# Patient Record
Sex: Male | Born: 1945 | Race: White | Hispanic: No | Marital: Single | Smoking: Never smoker
Health system: Southern US, Community
[De-identification: ages and names within clinical notes are randomized; demographics above are authoritative.]

## PROBLEM LIST (undated history)

## (undated) DIAGNOSIS — I480 Paroxysmal atrial fibrillation: Secondary | ICD-10-CM

## (undated) HISTORY — DX: Paroxysmal atrial fibrillation: I48.0

## (undated) HISTORY — PX: TONSILLECTOMY: SUR1361

---

## 2007-06-30 ENCOUNTER — Inpatient Hospital Stay (HOSPITAL_COMMUNITY): Admission: EM | Admit: 2007-06-30 | Discharge: 2007-07-01 | Payer: Self-pay | Admitting: Emergency Medicine

## 2007-07-01 ENCOUNTER — Encounter (INDEPENDENT_AMBULATORY_CARE_PROVIDER_SITE_OTHER): Payer: Self-pay | Admitting: Internal Medicine

## 2007-07-01 HISTORY — PX: TRANSTHORACIC ECHOCARDIOGRAM: SHX275

## 2007-07-29 HISTORY — PX: CARDIOVASCULAR STRESS TEST: SHX262

## 2010-09-20 ENCOUNTER — Ambulatory Visit: Payer: Self-pay | Admitting: Cardiovascular Disease

## 2011-02-24 NOTE — H&P (Signed)
[name], Underwood                ACCOUNT NO.:  [id number]   MEDICAL RECORD NO.:  [id number]          PATIENT TYPE:  INP   LOCATION:  1440                         FACILITY:  [facility]   PHYSICIAN:  Michelene Gardener, MD    DATE OF BIRTH:  [birth year]   DATE OF ADMISSION:  06/30/2007  DATE OF DISCHARGE:  07/01/2007                              HISTORY & PHYSICAL   CHIEF COMPLAINT:  Palpitations.   HISTORY OF PRESENT ILLNESS:  This is 65 year old male with no  significant past medical history, presented to the hospital with  palpitation.  The patient denied chest pain.  There is no shortness of  breath and there is no syncopal episode.  The patient does not have any  previous history of heart problems.  Upon arrival to the ER, he was  found to be in atrial fibrillation and rapid ventricular response.  Initially, he was given Cardizem IV and then was started on Cardizem  drip.  Currently, he is receiving heparin.  His atrial fibrillation was  converted to sinus.   PAST MEDICAL HISTORY:  Denied.   PAST SURGICAL HISTORY:  Denied.   MEDICATIONS:  None   ALLERGIES:  NO KNOWN DRUG ALLERGIES.   SOCIAL HISTORY:  No smoking, alcohol drinking and no recreational drugs.   FAMILY HISTORY:  No history of heart problems.   SURGICAL HISTORY:  Status post tonsillectomy.   REVIEW OF SYSTEMS:  As per the HPI.   PHYSICAL EXAMINATION:  VITAL SIGNS: His heart rate on admission is 143  and was atrial fibrillation.  Blood pressure was stable.  Respiratory  rate 16.  GENERAL APPEARANCE:  This is a middle-aged male not in acute distress.  HEENT: Conjunctivae no pallor or erythema.  Pupils are equal and  accommodation.  There is no ptosis.  Hearing is intact.  There is no ear  discharge, infection.  There is no nose infection.  Oral mucosa dry.  No  pharyngeal erythema.  NECK:  Supple.  No JVD, no carotid bruit, no lymphadenopathy, no thyroid  enlargement or thyroid tenderness.  CARDIOVASCULAR:  S1, S2  regular.  There is no murmurs.  No gallops or  thrills.  RESPIRATORY:  The patient is breathing between 16-18.  No use of  accessory muscles.  No intercostal retractions, dullness, no rales,  rhonchi and wheezes.  ABDOMEN:  Soft, nondistended, nontender, no hepatosplenomegaly.  Bowel  sounds are normal.  Umbilicus central.  LOWER EXTREMITIES:  No edema, no varicose veins.  SKIN:  No rash and erythema.  NEUROLOGICAL:  Cranial nerves are intact from II-XII.  There are no  motor or sensory deficits.   LABORATORY RESULTS:  CK-MB 1.3, troponin less than 0.05.  WBCs 6.6,  hemoglobin 14.8, hematocrit 42.6, MCV 85.4.  Magnesium 2.5, sodium of  135, potassium 3.7, chloride 102, bicarb 23, glucose 111, BUN 9,  creatinine 0.88, INR 1.0.  Initial EKG is atrial fibrillation at a rate  or 147 beats per minute.  There is no evidence of acute ischemia.  Repeat EKG showed atrial fibrillation, nitrates 85 beats per minute, and  currently  he is sinus rhythm and is rate controlled.   ASSESSMENT:  Paroxysmal atrial fibrillation.  This patient presented  with atrial fibrillation with rate uncontrolled, was given IV Cardizem  in the ER and then was put in Cardizem drip.  Currently is in Cardizem  drip and his rate improved to 90s.  I will watch him in telemetry.  Will  get three sets of troponin and cardiac enzymes.  Will get  echocardiogram.  He is already on heparin drip.  I will continue his  heparin, and if his atrial fibrillation repairs, then will consider  putting him on Coumadin.  Otherwise, I would just recommend him to be  discharged on aspirin.  If his atrial fibrillation recurs, then will  consider cardiology evaluation.   ASSESSMENT TIME:  Forty five minutes.      Michelene Gardener, MD  Electronically Signed     NAE/MEDQ  D:  07/19/2007  T:  07/21/2007  Job:  [id number]

## 2011-02-24 NOTE — Discharge Summary (Signed)
[name], [name]                ACCOUNT NO.:  [id number]   MEDICAL RECORD NO.:  [id number]          PATIENT TYPE:  INP   LOCATION:  1440                         FACILITY:  [facility]   PHYSICIAN:  Michelene Gardener, MD    DATE OF BIRTH:  July 28, 1946   DATE OF ADMISSION:  06/30/2007  DATE OF DISCHARGE:  07-19-2007                               DISCHARGE SUMMARY   DISCHARGE DIAGNOSIS:  Lone atrial fibrillation.   DISCHARGE MEDICATIONS:  Aspirin 325 mg p.o. once daily.   CONSULTATIONS:  None.   PROCEDURES:  None.   FOLLOW-UP:  Appointment with his primary physician in 1-2 weeks.   RADIOLOGY STUDIES:  1. Echocardiogram done 10-10-2024showed ejection fraction of 55%,      and there are no wall motion abnormalities.  2. Chest x-ray done on September 21 showed no acute findings.   COURSE OF HOSPITALIZATION:  This is a 65 year old male with no  significant past medical history who presented to the hospital with  palpitations.  He was found to be in atrial fibrillation with rapid  ventricular response.  The patient was given IV Cardizem and then was  started on Cardizem drip and was put on heparin drip.  He was monitored  in telemetry.  His atrial fibrillation converted to sinus, and his rate  became controlled.  The patient was taken from Cardizem drip on the same  day of admission, and he remains in sinus rhythm, rate controlled.  Echocardiogram was done and came to be normal.  Three sets of troponin  and cardiac enzymes were done and they came to be normal.  His  electrocardiogram did not show evidence of ischemia.  His atrial  fibrillation was felt to be lone atrial fibrillation, and there is no  need for long-term anticoagulation.  The patient can be treated with  aspirin 325 mg once a day.  He was advised to follow with his primary  physician to ensure that it will not come again.  Otherwise, he remains  stable.  Will be discharged home on aspirin.   ASSESSMENT TIME:  One hour.   Studies 40 minutes.      Michelene Gardener, MD  Electronically Signed     NAE/MEDQ  D:  07/19/2007  T:  07/21/2007  Job:  [id number]

## 2011-07-20 LAB — CARDIAC PANEL(CRET KIN+CKTOT+MB+TROPI)
Relative Index: INVALID
Relative Index: INVALID
Total CK: 90
Troponin I: 0.01
Troponin I: 0.02

## 2011-07-20 LAB — POCT CARDIAC MARKERS
CKMB, poc: 1.3
Troponin i, poc: <0.05

## 2011-07-20 LAB — COMPREHENSIVE METABOLIC PANEL
ALT: 22
AST: 25
Albumin: 4.2
Alkaline Phosphatase: 47
Chloride: 102
GFR calc Af Amer: >60
Potassium: 3.7
Sodium: 135
Total Bilirubin: 0.9

## 2011-07-20 LAB — CBC
HCT: 42.6
Platelets: 186
WBC: 6.6

## 2011-07-20 LAB — CK TOTAL AND CKMB (NOT AT ARMC)
CK, MB: 2.4
Relative Index: 1.4

## 2011-08-18 ENCOUNTER — Encounter: Payer: Self-pay | Admitting: Cardiovascular Disease

## 2011-08-22 ENCOUNTER — Ambulatory Visit (INDEPENDENT_AMBULATORY_CARE_PROVIDER_SITE_OTHER): Payer: Medicare Other | Admitting: Cardiovascular Disease

## 2011-08-22 ENCOUNTER — Encounter: Payer: Self-pay | Admitting: Cardiovascular Disease

## 2011-08-22 DIAGNOSIS — E785 Hyperlipidemia, unspecified: Secondary | ICD-10-CM

## 2011-08-22 DIAGNOSIS — R0789 Other chest pain: Secondary | ICD-10-CM | POA: Insufficient documentation

## 2011-08-22 DIAGNOSIS — R079 Chest pain, unspecified: Secondary | ICD-10-CM

## 2011-08-22 DIAGNOSIS — I10 Essential (primary) hypertension: Secondary | ICD-10-CM

## 2011-08-22 DIAGNOSIS — I4891 Unspecified atrial fibrillation: Secondary | ICD-10-CM

## 2011-08-22 NOTE — Assessment & Plan Note (Signed)
He's not had any further episodes of atrial fibrillation. We'll continue the same medications.

## 2011-08-22 NOTE — Patient Instructions (Signed)
Your physician wants you to follow-up in: 1 year or sooner if you need. You will receive a reminder letter in the mail two months in advance. If you don't receive a letter, please call our office to schedule the follow-up appointment.  Your physician recommends that you return for a FASTING lipid profile: when can, lab open 8:30-1:00

## 2011-08-22 NOTE — Progress Notes (Signed)
[name] Date of Birth  September 26, 1946 [facility] [address]    [address] [place], [place]  [address] [phone]  Fax  [phone]  History of Present Illness:  [name] is a 65 y.o. gentleman who I met in 2008. He is a retired Public house manager at  [employer]. He has intermittent atrial fibrillation and is also has some chest pains. He had a stress Myoview study in 2008 which revealed normal left ventricular systolic function and revealed no evidence of ischemia.  He has retired and moved to [place], [place].  He is exercising regularly.  He does lots of landscaping work.  He's able to be fairly vigorous exercise without any significant problems.   Current Outpatient Prescriptions on File Prior to Visit  Medication Sig Dispense Refill  . aspirin 325 MG tablet Take 325 mg by mouth daily.        . Multiple Vitamin (MULTIVITAMIN) tablet Take 1 tablet by mouth daily.          No Known Allergies  Past Medical History  Diagnosis Date  . Intermittent atrial fibrillation   . Chest pain     INTERMITTENT    Past Surgical History  Procedure Date  . Tonsillectomy   . Transthoracic echocardiogram 07/01/2007  . Cardiovascular stress test 07/29/2007    EF 63%. NO ISCHEMIA    History  Smoking status  . Former Smoker  . Quit date: 08/17/1981  Smokeless tobacco  . Not on file    History  Alcohol Use No    Family History  Problem Relation Age of Onset  . Coronary artery disease Father     Reviw of Systems:  Reviewed in the HPI.  All other systems are negative.  Physical Exam: BP 114/76  Pulse 74  Ht 5\' 9"  (1.753 m)  Wt 166 lb (75.297 kg)  BMI 24.51 kg/m2 The patient is alert and oriented x 3.  The mood and affect are normal.   Skin: warm and dry.  Color is normal.    HEENT:   His neck is supple. There is no JVD. He has normal carotids.  Lungs: His lung exam is clear.   Heart: Heart regular rate S1-S2. There are no murmurs.    Abdomen: His abdomen has good bowel sounds. There  is no hepatomegaly.  Extremities:  No clubbing cyanosis or edema.  Neuro:  Neuro exam is nonfocal.    ECG: Is normal sinus rhythm. Has voltage for left ventricular hypertrophy.  Assessment / Plan:

## 2011-08-22 NOTE — Assessment & Plan Note (Signed)
He's doing much more exercise than he was previous the. He now lives on a [place] acre tract and does lots of landscaping and woodcutting. I am pleased that he is not having episodes of chest pain. I'll see him again in one year

## 2013-02-17 ENCOUNTER — Encounter: Payer: Self-pay | Admitting: Cardiovascular Disease

## 2013-05-09 DEATH — deceased

## 2013-07-01 ENCOUNTER — Ambulatory Visit (INDEPENDENT_AMBULATORY_CARE_PROVIDER_SITE_OTHER): Payer: Medicare Other | Admitting: Cardiovascular Disease

## 2013-07-01 ENCOUNTER — Encounter: Payer: Self-pay | Admitting: Cardiovascular Disease

## 2013-07-01 VITALS — BP 124/62 | HR 74 | Ht 69.0 in | Wt 164.0 lb

## 2013-07-01 DIAGNOSIS — I4891 Unspecified atrial fibrillation: Secondary | ICD-10-CM

## 2013-07-01 NOTE — Assessment & Plan Note (Signed)
[name] has done well.  He has occasional episodes of palpitations that may be due to AF.  I have asked him to get an ECG at the local urgent care office if he is having palpitations so that we can document the presence or absence of AF.    I will see him again in 1 year.

## 2013-07-01 NOTE — Progress Notes (Signed)
[name] Date of Birth  05-04-1946 [facility] [address]    [address] [place], [place]  [address] [phone]  Fax  [phone]  History of Present Illness:  [name] is a 67 y.o. gentleman who I met in 2008. He is a retired Public house manager at  [employer]. He has intermittent atrial fibrillation and is also has some chest pains. He had a stress Myoview study in 2008 which revealed normal left ventricular systolic function and revealed no evidence of ischemia.  He has retired and moved to [place], [place].  He is exercising regularly.  He does lots of landscaping work.  He's able to be fairly vigorous exercise without any significant problems.  Sept. 23, 2014:  [name] is doing well.  He is enjoying retirement.  He just got back from a hiking trip in [place].  He is busy chopping wood and hiking without CP or dyspnea.    He has occasional palpitations.     Current Outpatient Prescriptions on File Prior to Visit  Medication Sig Dispense Refill  . aspirin 325 MG tablet Take 325 mg by mouth daily.        . Multiple Vitamin (MULTIVITAMIN) tablet Take 1 tablet by mouth daily.        . Omega-3 Fatty Acids (FISH OIL PO) Take by mouth.         No current facility-administered medications on file prior to visit.    No Known Allergies  Past Medical History  Diagnosis Date  . Intermittent atrial fibrillation   . Chest pain     INTERMITTENT    Past Surgical History  Procedure Laterality Date  . Tonsillectomy    . Transthoracic echocardiogram  07/01/2007  . Cardiovascular stress test  07/29/2007    EF 63%. NO ISCHEMIA    History  Smoking status  . Former Smoker  . Quit date: 08/17/1981  Smokeless tobacco  . Not on file    History  Alcohol Use No    Family History  Problem Relation Age of Onset  . Coronary artery disease Father     Reviw of Systems:  Reviewed in the HPI.  All other systems are negative.  Physical Exam: BP 124/62  Pulse 74  Ht 5\' 9"  (1.753 m)  Wt 164 lb  (74.39 kg)  BMI 24.21 kg/m2 The patient is alert and oriented x 3.  The mood and affect are normal.   Skin: warm and dry.  Color is normal.    HEENT:   His neck is supple. There is no JVD. He has normal carotids.  Lungs: His lung exam is clear.   Heart: Heart regular rate S1-S2. There are no murmurs.    Abdomen: His abdomen has good bowel sounds. There is no hepatomegaly.  Extremities:  No clubbing cyanosis or edema.  Neuro:  Neuro exam is nonfocal.    ECG: Sept. 23, 2014:  NSR at 72, occasional PVCs.    Assessment / Plan:

## 2013-07-01 NOTE — Patient Instructions (Addendum)
Your physician wants you to follow-up in: 1 year with ekg, You will receive a reminder letter in the mail two months in advance. If you don't receive a letter, please call our office to schedule the follow-up appointment.   Your physician recommends that you continue on your current medications as directed. Please refer to the Current Medication list given to you today.

## 2014-07-21 ENCOUNTER — Encounter: Payer: Self-pay | Admitting: Cardiovascular Disease

## 2014-07-21 ENCOUNTER — Ambulatory Visit (INDEPENDENT_AMBULATORY_CARE_PROVIDER_SITE_OTHER): Payer: Medicare Other | Admitting: Cardiovascular Disease

## 2014-07-21 VITALS — BP 116/64 | HR 73 | Ht 69.0 in | Wt 166.0 lb

## 2014-07-21 DIAGNOSIS — R0989 Other specified symptoms and signs involving the circulatory and respiratory systems: Secondary | ICD-10-CM

## 2014-07-21 DIAGNOSIS — E785 Hyperlipidemia, unspecified: Secondary | ICD-10-CM

## 2014-07-21 DIAGNOSIS — Z136 Encounter for screening for cardiovascular disorders: Secondary | ICD-10-CM

## 2014-07-21 DIAGNOSIS — I4891 Unspecified atrial fibrillation: Secondary | ICD-10-CM

## 2014-07-21 MED ORDER — ASPIRIN EC 81 MG PO TBEC: 81.0000 mg | DELAYED_RELEASE_TABLET | Freq: Every day | ORAL | Status: AC

## 2014-07-21 NOTE — Assessment & Plan Note (Signed)
Stable

## 2014-07-21 NOTE — Assessment & Plan Note (Signed)
He's not had any episodes of atrial fibrillation. We will reduce his aspirin to 81 mg a day.

## 2014-07-21 NOTE — Patient Instructions (Addendum)
Your physician has recommended you make the following change in your medication:   DECREASE YOUR ASPIRIN TO 81 MG DAILY   Your physician recommends that you return for lab work in: 3 WEEKS TO CHECK A (BMET, LIPIDS, AND LFT)  Your physician has requested that you have an abdominal aorta duplex. During this test, an ultrasound is used to evaluate the aorta. Allow 30 minutes for this exam. Do not eat after midnight the day before and avoid carbonated beverages  Your physician wants you to follow-up in: ONE YEAR WITH DR Elease HashimotoNAHSER You will receive a reminder letter in the mail two months in advance. If you don't receive a letter, please call our office to schedule the follow-up appointment.

## 2014-07-21 NOTE — Assessment & Plan Note (Signed)
He has a pulsitile abdominal aorta. Will get an abdominal aortic duplex to evaluate for AAA.  Hx has a family hx of athersclerosis.

## 2014-07-21 NOTE — Progress Notes (Signed)
Bernardo Heater Date of Birth  09-08-1946 [facility] [address]    [place], [place]  [address] [phone]  Fax  [phone]  History of Present Illness:  Naphtali is a 68 y.o. gentleman who I met in 2008. He is a retired Scientist, physiological at  [employer]. He has intermittent atrial fibrillation and is also has some chest pains. He had a stress Myoview study in 2008 which revealed normal left ventricular systolic function and revealed no evidence of ischemia.  He has retired and moved to [place], [place].  He is exercising regularly.  He does lots of landscaping work.  He's able to be fairly vigorous exercise without any significant problems.  Sept. 23, 2014:  Edwing is doing well.  He is enjoying retirement.  He just got back from a hiking trip in [place].  He is busy chopping wood and hiking without CP or dyspnea.    He has occasional palpitations.    Oct. 13, 2015:  Hussein is going to [place] this week.    He remains very active. He denies any chest pain or shortness of breath.   Current Outpatient Prescriptions on File Prior to Visit  Medication Sig Dispense Refill  . aspirin 325 MG tablet Take 325 mg by mouth daily.        . Multiple Vitamin (MULTIVITAMIN) tablet Take 1 tablet by mouth daily.         No current facility-administered medications on file prior to visit.    No Known Allergies  Past Medical History  Diagnosis Date  . Intermittent atrial fibrillation   . Chest pain     INTERMITTENT    Past Surgical History  Procedure Laterality Date  . Tonsillectomy    . Transthoracic echocardiogram  07/01/2007  . Cardiovascular stress test  07/29/2007    EF 63%. NO ISCHEMIA    History  Smoking status  . Former Smoker  . Quit date: 08/17/1981  Smokeless tobacco  . Not on file    History  Alcohol Use No    Family History  Problem Relation Age of Onset  . Coronary artery disease Father     Reviw of Systems:  Reviewed in the HPI.  All other systems are  negative.  Physical Exam: BP 116/64  Pulse 73  Ht _0  (1.753 m)  Wt 166 lb (75.297 kg)  BMI 24.50 kg/m2 The patient is alert and oriented x 3.  The mood and affect are normal.   Skin: warm and dry.  Color is normal.    HEENT:   His neck is supple. There is no JVD. He has normal carotids.  Lungs: His lung exam is clear.   Heart: Heart regular rate S1-S2. There are no murmurs.    Abdomen: His abdomen has good bowel sounds. There is no hepatomegaly.  I was able to palpate his abdomina aorta . No bruits   Extremities:  No clubbing cyanosis or edema.  Neuro:  Neuro exam is nonfocal.    ECG: Oct. 13, 2015:  NSR at 47. Normal      Assessment / Plan:

## 2014-08-20 ENCOUNTER — Other Ambulatory Visit (INDEPENDENT_AMBULATORY_CARE_PROVIDER_SITE_OTHER): Payer: Medicare Other | Admitting: *Deleted

## 2014-08-20 ENCOUNTER — Ambulatory Visit (HOSPITAL_COMMUNITY): Payer: Medicare Other | Attending: Cardiology | Admitting: Cardiology

## 2014-08-20 DIAGNOSIS — Z136 Encounter for screening for cardiovascular disorders: Secondary | ICD-10-CM

## 2014-08-20 DIAGNOSIS — I700 Atherosclerosis of aorta: Secondary | ICD-10-CM

## 2014-08-20 DIAGNOSIS — I4891 Unspecified atrial fibrillation: Secondary | ICD-10-CM

## 2014-08-20 DIAGNOSIS — E785 Hyperlipidemia, unspecified: Secondary | ICD-10-CM

## 2014-08-20 DIAGNOSIS — R0989 Other specified symptoms and signs involving the circulatory and respiratory systems: Secondary | ICD-10-CM

## 2014-08-20 LAB — BASIC METABOLIC PANEL
BUN: 20 mg/dL (ref 6–23)
CALCIUM: 9.2 mg/dL (ref 8.4–10.5)
CO2: 25 meq/L (ref 19–32)
CREATININE: 0.9 mg/dL (ref 0.4–1.5)
Chloride: 105 mEq/L (ref 96–112)
GFR: 91.11 mL/min (ref >60.00)
GLUCOSE: 103 mg/dL — AB (ref 70–99)
Potassium: 4.3 mEq/L (ref 3.5–5.1)
Sodium: 139 mEq/L (ref 135–145)

## 2014-08-20 LAB — HEPATIC FUNCTION PANEL
ALBUMIN: 3.7 g/dL (ref 3.5–5.2)
ALK PHOS: 41 U/L (ref 39–117)
ALT: 27 U/L (ref 0–53)
AST: 25 U/L (ref 0–37)
BILIRUBIN TOTAL: 0.8 mg/dL (ref 0.2–1.2)
Bilirubin, Direct: 0 mg/dL (ref 0.0–0.3)
Total Protein: 7.1 g/dL (ref 6.0–8.3)

## 2014-08-20 LAB — LIPID PANEL
Cholesterol: 185 mg/dL (ref 0–200)
HDL: 51.1 mg/dL (ref >39.00)
LDL CALC: 121 mg/dL — AB (ref 0–99)
NONHDL: 133.9
Total CHOL/HDL Ratio: 4
Triglycerides: 65 mg/dL (ref 0.0–149.0)
VLDL: 13 mg/dL (ref 0.0–40.0)

## 2014-08-20 NOTE — Progress Notes (Signed)
Abdominal aorta duplex performed  

## 2014-08-21 ENCOUNTER — Telehealth: Payer: Self-pay | Admitting: Nurse Practitioner

## 2014-08-21 DIAGNOSIS — E785 Hyperlipidemia, unspecified: Secondary | ICD-10-CM

## 2014-08-21 MED ORDER — ATORVASTATIN CALCIUM 20 MG PO TABS: 20.0000 mg | ORAL_TABLET | Freq: Every day | ORAL | Status: DC

## 2014-08-21 NOTE — Telephone Encounter (Signed)
Spoke with patient and advised of normal AAA duplex.  Advised patient of lab results and recommendation from Dr. Elease HashimotoNahser to start  Atorvastatin 20 mg once daily and to recheck labs in 3 months.  Lab appointment made for 11/26/14, labs in epic,  and Rx sent to patient's pharmacy.  Patient verbalized understanding and agreement.

## 2014-08-21 NOTE — Telephone Encounter (Signed)
-----   Message from Vesta MixerPhilip J Nahser, MD sent at 08/20/2014  5:42 PM EST ----- + family hx of vascular disease. Add atorvastatin 20 mg a day Recheck labs in 3 months

## 2014-11-26 ENCOUNTER — Other Ambulatory Visit (INDEPENDENT_AMBULATORY_CARE_PROVIDER_SITE_OTHER): Payer: Medicare Other | Admitting: *Deleted

## 2014-11-26 DIAGNOSIS — E785 Hyperlipidemia, unspecified: Secondary | ICD-10-CM

## 2014-11-26 LAB — LIPID PANEL
CHOL/HDL RATIO: 2
Cholesterol: 143 mg/dL (ref 0–200)
HDL: 60 mg/dL (ref >39.00)
LDL Cholesterol: 69 mg/dL (ref 0–99)
NONHDL: 83
Triglycerides: 72 mg/dL (ref 0.0–149.0)
VLDL: 14.4 mg/dL (ref 0.0–40.0)

## 2014-11-26 LAB — HEPATIC FUNCTION PANEL
ALT: 28 U/L (ref 0–53)
AST: 24 U/L (ref 0–37)
Albumin: 4.3 g/dL (ref 3.5–5.2)
Alkaline Phosphatase: 43 U/L (ref 39–117)
BILIRUBIN DIRECT: 0.2 mg/dL (ref 0.0–0.3)
BILIRUBIN TOTAL: 1.1 mg/dL (ref 0.2–1.2)
Total Protein: 7.2 g/dL (ref 6.0–8.3)

## 2015-08-26 ENCOUNTER — Encounter: Payer: Self-pay | Admitting: Cardiovascular Disease

## 2015-08-26 ENCOUNTER — Ambulatory Visit (INDEPENDENT_AMBULATORY_CARE_PROVIDER_SITE_OTHER): Payer: Medicare Other | Admitting: Cardiovascular Disease

## 2015-08-26 VITALS — BP 122/60 | HR 77 | Ht 69.0 in | Wt 169.8 lb

## 2015-08-26 DIAGNOSIS — I480 Paroxysmal atrial fibrillation: Secondary | ICD-10-CM | POA: Diagnosis not present

## 2015-08-26 DIAGNOSIS — I4891 Unspecified atrial fibrillation: Secondary | ICD-10-CM | POA: Diagnosis not present

## 2015-08-26 DIAGNOSIS — E785 Hyperlipidemia, unspecified: Secondary | ICD-10-CM | POA: Insufficient documentation

## 2015-08-26 NOTE — Patient Instructions (Signed)
Medication Instructions:  Your physician recommends that you continue on your current medications as directed. Please refer to the Current Medication list given to you today.   Labwork: Your physician recommends that you return for lab work in: Soon (2-3 weeks) - cholesterol, liver, basic metabolic panel    Testing/Procedures: None Ordered   Follow-Up: Your physician wants you to follow-up in: 6 months with Dr. Elease HashimotoNahser.  You will receive a reminder letter in the mail two months in advance. If you don't receive a letter, please call our office to schedule the follow-up appointment.   If you need a refill on your cardiac medications before your next appointment, please call your pharmacy.   Thank you for choosing [facility]! Eligha BridegroomMichelle Cung Masterson, RN [phone]

## 2015-08-26 NOTE — Progress Notes (Signed)
Bernardo Heater Date of Birth  09/02/1946 [facility] [address]    [place], [place]  [address] [phone]  Fax  [phone]  History of Present Illness:  [name] is a 69 y.o. gentleman who I met in 2008. He is a retired Scientist, physiological at  [employer]. He has intermittent atrial fibrillation and is also has some chest pains. He had a stress Myoview study in 2008 which revealed normal left ventricular systolic function and revealed no evidence of ischemia.  He has retired and moved to [place], [place].  He is exercising regularly.  He does lots of landscaping work.  He's able to be fairly vigorous exercise without any significant problems.  Sept. 23, 2014:   Keiondre is doing well.  He is enjoying retirement.  He just got back from a hiking trip in [place].  He is busy chopping wood and hiking without CP or dyspnea.    He has occasional palpitations.    Oct. 13, 2015:  Minh is going to [place] this week.    He remains very active. He denies any chest pain or shortness of breath.  Nov. 17, 2016 Damyon is doing well.  Has some questions / issues with our corporate .    Current Outpatient Prescriptions on File Prior to Visit  Medication Sig Dispense Refill  . aspirin EC 81 MG tablet Take 1 tablet (81 mg total) by mouth daily. 90 tablet 3  . atorvastatin (LIPITOR) 20 MG tablet Take 1 tablet (20 mg total) by mouth daily at 6 PM. 31 tablet 11  . Multiple Vitamin (MULTIVITAMIN) tablet Take 1 tablet by mouth daily.       No current facility-administered medications on file prior to visit.    No Known Allergies  Past Medical History  Diagnosis Date  . Intermittent atrial fibrillation (HCC)   . Chest pain     INTERMITTENT    Past Surgical History  Procedure Laterality Date  . Tonsillectomy    . Transthoracic echocardiogram  07/01/2007  . Cardiovascular stress test  07/29/2007    EF 63%. NO ISCHEMIA    History  Smoking status  . Former Smoker  . Quit date: 08/17/1981   Smokeless tobacco  . Not on file    History  Alcohol Use No    Family History  Problem Relation Age of Onset  . Coronary artery disease Father     Reviw of Systems:  Reviewed in the HPI.  All other systems are negative.  Physical Exam: BP 122/60 mmHg  Pulse 77  Ht $R'5\' 9"'Hd$  (1.753 m)  Wt 169 lb 12.8 oz (77.021 kg)  BMI 25.06 kg/m2 The patient is alert and oriented x 3.  The mood and affect are normal.   Skin: warm and dry.  Color is normal.    HEENT:   His neck is supple. There is no JVD. He has normal carotids.  Lungs: His lung exam is clear.   Heart: Heart regular rate S1-S2. There are no murmurs.    Abdomen: His abdomen has good bowel sounds. There is no hepatomegaly.  I was able to palpate his abdomina aorta . No bruits   Extremities:  No clubbing cyanosis or edema.  Neuro:  Neuro exam is nonfocal.    ECG: 08/26/2015: Normal sinus rhythm at 77. EKG is normal.     Assessment / Plan:   1. Hyperlipidemia: He seems to be doing well on the current dose of atorvastatin. We'll recheck his lipids in several  weeks.  2. Paroxysmal atrial fibrillation: He is maintaining normal sinus rhythm.    Fabiana Dromgoole, Wonda Cheng, MD  08/26/2015 3:34 PM    [address],  [place], [place]  [address] Pager [phone] Phone: [phone]; Fax: [phone]   [facility]  [address] [place], [place]  [address] [phone]   Fax [phone]

## 2015-09-22 ENCOUNTER — Telehealth: Payer: Self-pay | Admitting: Nurse Practitioner

## 2015-09-22 ENCOUNTER — Other Ambulatory Visit: Payer: Self-pay | Admitting: Cardiovascular Disease

## 2015-09-22 NOTE — Telephone Encounter (Signed)
Called patient to remind him that per last office visit on 11/17 with Dr. Elease HashimotoNahser he needs a lab appointment for fasting blood work.  He states he is driving presently but he will call back to schedule when he gets a chance to look at his calendar.  He thanked me for the reminder.

## 2015-09-27 ENCOUNTER — Other Ambulatory Visit (INDEPENDENT_AMBULATORY_CARE_PROVIDER_SITE_OTHER): Payer: Medicare Other | Admitting: *Deleted

## 2015-09-27 DIAGNOSIS — E785 Hyperlipidemia, unspecified: Secondary | ICD-10-CM | POA: Diagnosis not present

## 2015-09-27 LAB — HEPATIC FUNCTION PANEL
ALT: 18 U/L (ref 9–46)
AST: 19 U/L (ref 10–35)
Albumin: 3.9 g/dL (ref 3.6–5.1)
Alkaline Phosphatase: 37 U/L — ABNORMAL LOW (ref 40–115)
Bilirubin, Direct: 0.2 mg/dL (ref <=0.2)
Indirect Bilirubin: 0.7 mg/dL (ref 0.2–1.2)
TOTAL PROTEIN: 6.9 g/dL (ref 6.1–8.1)
Total Bilirubin: 0.9 mg/dL (ref 0.2–1.2)

## 2015-09-27 LAB — LIPID PANEL
CHOLESTEROL: 133 mg/dL (ref 125–200)
HDL: 59 mg/dL (ref >=40)
LDL CALC: 64 mg/dL (ref <130)
TRIGLYCERIDES: 52 mg/dL (ref <150)
Total CHOL/HDL Ratio: 2.3 Ratio (ref <=5.0)
VLDL: 10 mg/dL (ref <30)

## 2015-09-27 LAB — BASIC METABOLIC PANEL
BUN: 20 mg/dL (ref 7–25)
CHLORIDE: 101 mmol/L (ref 98–110)
CO2: 28 mmol/L (ref 20–31)
CREATININE: 0.99 mg/dL (ref 0.70–1.18)
Calcium: 8.7 mg/dL (ref 8.6–10.3)
Glucose, Bld: 88 mg/dL (ref 65–99)
POTASSIUM: 3.9 mmol/L (ref 3.5–5.3)
Sodium: 135 mmol/L (ref 135–146)

## 2016-02-09 ENCOUNTER — Ambulatory Visit: Payer: Medicare Other | Admitting: Cardiovascular Disease

## 2016-03-09 ENCOUNTER — Encounter: Payer: Self-pay | Admitting: Cardiovascular Disease

## 2016-03-09 ENCOUNTER — Ambulatory Visit (INDEPENDENT_AMBULATORY_CARE_PROVIDER_SITE_OTHER): Payer: Medicare Other | Admitting: Cardiovascular Disease

## 2016-03-09 VITALS — BP 126/70 | HR 70 | Ht 69.0 in | Wt 167.4 lb

## 2016-03-09 DIAGNOSIS — E785 Hyperlipidemia, unspecified: Secondary | ICD-10-CM

## 2016-03-09 DIAGNOSIS — I480 Paroxysmal atrial fibrillation: Secondary | ICD-10-CM

## 2016-03-09 NOTE — Progress Notes (Signed)
[name] Date of Birth  03/26/1946 [facility] [address]    [address] [place], [place]  [address] [phone]  Fax  [phone]  History of Present Illness:  [name] is a 70 y.o. gentleman who I met in 2008. 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 He has intermittent atrial fibrillation and is also has some chest pains. He had a stress Myoview study in 2008 which revealed normal left ventricular systolic function and revealed no evidence of ischemia.  He has retired and moved to [place], [place].  He is exercising regularly.  He does lots of landscaping work.  He's able to be fairly vigorous exercise without any significant problems.  Sept. 23, 2014:   [name] is doing well.  He is enjoying retirement.  He just got back from a hiking trip in [place].  He is busy chopping wood and hiking without CP or dyspnea.    He has occasional palpitations.    Oct. 13, 2015:  [name] is going to [place] this week.    He remains very active. He denies any chest pain or shortness of breath.  Nov. 17, 2016 [name] is doing well.  Has some questions / issues with our corporate .   March 09, 2016:  [name] is seen back for follow up of his paroxysmal atrial ablation and hyperlipidemia. Doing well.   Just got back from [place]. ( [place], [place], [place])  Current Outpatient Prescriptions on File Prior to Visit  Medication Sig Dispense Refill  . aspirin EC 81 MG tablet Take 1 tablet (81 mg total) by mouth daily. 90 tablet 3  . atorvastatin (LIPITOR) 20 MG tablet TAKE 1 TABLET BY MOUTH EVERY DAY AT 6 PM 31 tablet 11  . Multiple Vitamin (MULTIVITAMIN) tablet Take 1 tablet by mouth daily.       No current facility-administered medications on file prior to visit.    No Known Allergies  Past Medical History  Diagnosis Date  . Intermittent atrial fibrillation (HCC)   . Chest pain     INTERMITTENT    Past Surgical History  Procedure Laterality Date  . Tonsillectomy    . Transthoracic  echocardiogram  07/01/2007  . Cardiovascular stress test  07/29/2007    EF 63%. NO ISCHEMIA    History  Smoking status  . Former Smoker  . Quit date: 08/17/1981  Smokeless tobacco  . Not on file    History  Alcohol Use No    Family History  Problem Relation Age of Onset  . Coronary artery disease Father     Reviw of Systems:  Reviewed in the HPI.  All other systems are negative.  Physical Exam: BP 126/70 mmHg  Pulse 70  Ht 5' 9" (1.753 m)  Wt 167 lb 6.4 oz (75.932 kg)  BMI 24.71 kg/m2 The patient is alert and oriented x 3.  The mood and affect are normal.   Skin: warm and dry.  Color is normal.    HEENT:   His neck is supple. There is no JVD. He has normal carotids.  Lungs: His lung exam is clear.   Heart: Heart regular rate S1-S2. There are no murmurs.    Abdomen: His abdomen has good bowel sounds. There is no hepatomegaly.  I was able to palpate his abdomina aorta . No bruits   Extremities:  No clubbing cyanosis or edema.  Neuro:  Neuro exam is nonfocal.    ECG:  Assessment / Plan:   1. Hyperlipidemia: He   seems to be doing well on the current dose of atorvastatin. We'll recheck his lipids in several weeks. Exercises vigorously .    2. Paroxysmal atrial fibrillation: He is maintaining normal sinus rhythm.    Mertie Moores, MD  03/09/2016 3:24 PM    [facility] [place],  [place], [place]  [address] Pager [phone] Phone: [phone]; Fax: [phone]   [facility]  [address] [place], [place]  [address] [phone]   Fax [phone]

## 2016-03-09 NOTE — Patient Instructions (Signed)
Medication Instructions:  Your physician recommends that you continue on your current medications as directed. Please refer to the Current Medication list given to you today.   Labwork: TODAY - cholesterol, complete metabolic panel   Testing/Procedures: None Ordered   Follow-Up: Your physician wants you to follow-up in: 1 year with Dr. Nahser.  You will receive a reminder letter in the mail two months in advance. If you don't receive a letter, please call our office to schedule the follow-up appointment.   If you need a refill on your cardiac medications before your next appointment, please call your pharmacy.   Thank you for choosing [facility]! Mathis Cashman, RN [phone]    

## 2016-03-10 LAB — COMPREHENSIVE METABOLIC PANEL
ALK PHOS: 40 U/L (ref 40–115)
ALT: 17 U/L (ref 9–46)
AST: 17 U/L (ref 10–35)
Albumin: 4.3 g/dL (ref 3.6–5.1)
BUN: 23 mg/dL (ref 7–25)
CALCIUM: 9.1 mg/dL (ref 8.6–10.3)
CHLORIDE: 104 mmol/L (ref 98–110)
CO2: 25 mmol/L (ref 20–31)
Creat: 0.96 mg/dL (ref 0.70–1.18)
GLUCOSE: 98 mg/dL (ref 65–99)
POTASSIUM: 3.9 mmol/L (ref 3.5–5.3)
Sodium: 139 mmol/L (ref 135–146)
Total Bilirubin: 0.5 mg/dL (ref 0.2–1.2)
Total Protein: 6.9 g/dL (ref 6.1–8.1)

## 2016-03-10 LAB — LIPID PANEL
CHOLESTEROL: 129 mg/dL (ref 125–200)
HDL: 64 mg/dL (ref >=40)
LDL Cholesterol: 42 mg/dL (ref <130)
TRIGLYCERIDES: 116 mg/dL (ref <150)
Total CHOL/HDL Ratio: 2 Ratio (ref <=5.0)
VLDL: 23 mg/dL (ref <30)

## 2016-08-25 ENCOUNTER — Ambulatory Visit
Admission: RE | Admit: 2016-08-25 | Discharge: 2016-08-25 | Disposition: A | Discharge status: Home / Self Care | Payer: Medicare Other | Source: Ambulatory Visit | Attending: Family Medicine | Admitting: Family Medicine

## 2016-08-25 ENCOUNTER — Other Ambulatory Visit: Payer: Self-pay | Admitting: Family Medicine

## 2016-08-25 DIAGNOSIS — R109 Unspecified abdominal pain: Secondary | ICD-10-CM

## 2016-08-29 ENCOUNTER — Other Ambulatory Visit: Payer: Self-pay | Admitting: Family Medicine

## 2016-08-29 DIAGNOSIS — N2889 Other specified disorders of kidney and ureter: Secondary | ICD-10-CM

## 2016-09-09 ENCOUNTER — Ambulatory Visit
Admission: RE | Admit: 2016-09-09 | Discharge: 2016-09-09 | Disposition: A | Discharge status: Home / Self Care | Payer: Medicare Other | Source: Ambulatory Visit | Attending: Family Medicine | Admitting: Family Medicine

## 2016-09-09 DIAGNOSIS — N2889 Other specified disorders of kidney and ureter: Secondary | ICD-10-CM

## 2016-09-09 MED ORDER — GADOBENATE DIMEGLUMINE 529 MG/ML IV SOLN
15.0000 mL | Freq: Once | INTRAVENOUS | Status: AC | PRN
  Administered 2016-09-09: 15 mL via INTRAVENOUS

## 2016-10-21 ENCOUNTER — Other Ambulatory Visit: Payer: Self-pay | Admitting: Cardiovascular Disease

## 2017-04-08 ENCOUNTER — Other Ambulatory Visit: Payer: Self-pay | Admitting: Cardiovascular Disease

## 2017-05-23 ENCOUNTER — Encounter: Payer: Self-pay | Admitting: Cardiovascular Disease

## 2017-06-14 ENCOUNTER — Encounter: Payer: Self-pay | Admitting: Cardiovascular Disease

## 2017-06-14 ENCOUNTER — Ambulatory Visit (INDEPENDENT_AMBULATORY_CARE_PROVIDER_SITE_OTHER): Payer: Medicare Other | Admitting: Cardiovascular Disease

## 2017-06-14 ENCOUNTER — Encounter (INDEPENDENT_AMBULATORY_CARE_PROVIDER_SITE_OTHER): Payer: Self-pay

## 2017-06-14 VITALS — BP 116/70 | HR 64 | Ht 69.0 in | Wt 163.0 lb

## 2017-06-14 DIAGNOSIS — E782 Mixed hyperlipidemia: Secondary | ICD-10-CM | POA: Diagnosis not present

## 2017-06-14 DIAGNOSIS — I480 Paroxysmal atrial fibrillation: Secondary | ICD-10-CM | POA: Diagnosis not present

## 2017-06-14 LAB — BASIC METABOLIC PANEL
BUN/Creatinine Ratio: 18 (ref 10–24)
BUN: 16 mg/dL (ref 8–27)
CALCIUM: 9.2 mg/dL (ref 8.6–10.2)
CO2: 24 mmol/L (ref 20–29)
CREATININE: 0.88 mg/dL (ref 0.76–1.27)
Chloride: 98 mmol/L (ref 96–106)
GFR, EST AFRICAN AMERICAN: 99 mL/min/{1.73_m2} (ref >59)
GFR, EST NON AFRICAN AMERICAN: 86 mL/min/{1.73_m2} (ref >59)
Glucose: 89 mg/dL (ref 65–99)
Potassium: 4.4 mmol/L (ref 3.5–5.2)
SODIUM: 138 mmol/L (ref 134–144)

## 2017-06-14 LAB — HEPATIC FUNCTION PANEL
ALBUMIN: 4.5 g/dL (ref 3.5–4.8)
ALK PHOS: 45 IU/L (ref 39–117)
ALT: 18 IU/L (ref 0–44)
AST: 22 IU/L (ref 0–40)
BILIRUBIN TOTAL: 0.8 mg/dL (ref 0.0–1.2)
Bilirubin, Direct: 0.19 mg/dL (ref 0.00–0.40)
Total Protein: 7 g/dL (ref 6.0–8.5)

## 2017-06-14 LAB — LIPID PANEL
CHOLESTEROL TOTAL: 136 mg/dL (ref 100–199)
Chol/HDL Ratio: 2.2 ratio (ref 0.0–5.0)
HDL: 63 mg/dL (ref >39)
LDL Calculated: 50 mg/dL (ref 0–99)
TRIGLYCERIDES: 115 mg/dL (ref 0–149)
VLDL CHOLESTEROL CAL: 23 mg/dL (ref 5–40)

## 2017-06-14 NOTE — Progress Notes (Signed)
[name] Date of Birth  [birth year] [facility] [address]    [place], [place]  [address] [phone]  Fax  [phone]  History of Present Illness:  [name] is a 71 y.o. gentleman who I met in 2008. He is a retired Scientist, physiological at  [employer]. He has intermittent atrial fibrillation and is also has some chest pains. He had a stress Myoview study in 2008 which revealed normal left ventricular systolic function and revealed no evidence of ischemia.  He has retired and moved to [place], [place].  He is exercising regularly.  He does lots of landscaping work.  He's able to be fairly vigorous exercise without any significant problems.  Sept. 23, 2014:   [name] is doing well.  He is enjoying retirement.  He just got back from a hiking trip in [place].  He is busy chopping wood and hiking without CP or dyspnea.    He has occasional palpitations.    Oct. 13, 2015:  [name] is going to [place] this week.    He remains very active. He denies any chest pain or shortness of breath.  Nov. 17, 2016 [name] is doing well.  Has some questions / issues with our corporate .   March 09, 2016:  [name] is seen back for follow up of his paroxysmal atrial ablation and hyperlipidemia. Doing well.   Just got back from [place]. ( [place], [place], [place])  Sept. 6, 2018:  Has been doing well [place] to [place] and [place] recently.   No Pain, no dyspne.  No recurrent atrial fib    Current Outpatient Prescriptions on File Prior to Visit  Medication Sig Dispense Refill  . aspirin EC 81 MG tablet Take 1 tablet (81 mg total) by mouth daily. 90 tablet 3  . atorvastatin (LIPITOR) 20 MG tablet TAKE 1 TABLET BY MOUTH EVERY DAY AT 6 PM 30 tablet 2  . Multiple Vitamin (MULTIVITAMIN) tablet Take 1 tablet by mouth daily.       No current facility-administered medications on file prior to visit.     No Known Allergies  Past Medical History:  Diagnosis Date  . Chest pain    INTERMITTENT   . Intermittent atrial fibrillation [facility])     Past Surgical History:  Procedure Laterality Date  . CARDIOVASCULAR STRESS TEST  07/29/2007   EF 63%. NO ISCHEMIA  . TONSILLECTOMY    . TRANSTHORACIC ECHOCARDIOGRAM  07/01/2007    History  Smoking Status  . Former Smoker  . Quit date: 08/17/1981  Smokeless Tobacco  . Never Used    History  Alcohol Use No    Family History  Problem Relation Age of Onset  . Coronary artery disease Father     Reviw of Systems:  Reviewed in the HPI.  All other systems are negative.  Physical Exam: BP 116/70   Pulse 64   Ht _0  (1.753 m)   Wt 163 lb (73.9 kg)   SpO2 99%   BMI 24.07 kg/m  The patient is alert and oriented x 3.  The mood and affect are normal.   Skin: warm and dry.  Color is normal.   HEENT:   His neck is supple. There is no JVD. He has normal carotids. Lungs: clear to auscultation  Heart: RR, normal S1S2, no murmurs    Abdomen: His abdomen has good bowel sounds. There is no hepatomegaly.  I was able to palpate his abdomina aorta . No bruits  Extremities:  No  c/c/e  Neuro:  Neuro exam is nonfocal.    ECG: 06/14/2017: Normal sinus rhythm at 64. Normal EKG.  Assessment / Plan:   1. Hyperlipidemia: Doing great on the current dose of atorvastatin. We'll check labs including complete metabolic profile and fasting lipids today. He exercises 2-2-1/2 hours a day.  2. Paroxysmal atrial fibrillation: He is maintaining normal sinus rhythm. Has not had any further episodes of PAF    Mertie Moores, MD  06/14/2017 11:10 AM    [place],  [place], [place]  [address] Pager [phone] Phone: [phone]; Fax: [phone]

## 2017-06-14 NOTE — Patient Instructions (Signed)
Medication Instructions:  Your physician recommends that you continue on your current medications as directed. Please refer to the Current Medication list given to you today.   Labwork: TODAY - cholesterol, basic metabolic panel, liver panel   Testing/Procedures: None Ordered   Follow-Up: Your physician wants you to follow-up in: 1 year with Dr. Nahser.  You will receive a reminder letter in the mail two months in advance. If you don't receive a letter, please call our office to schedule the follow-up appointment.   If you need a refill on your cardiac medications before your next appointment, please call your pharmacy.   Thank you for choosing [facility]! Amyriah Buras, RN [phone]    

## 2017-07-09 ENCOUNTER — Other Ambulatory Visit: Payer: Self-pay

## 2017-07-09 MED ORDER — ATORVASTATIN CALCIUM 20 MG PO TABS: ORAL_TABLET | ORAL | 1 refills | Status: DC

## 2017-07-10 ENCOUNTER — Other Ambulatory Visit: Payer: Self-pay | Admitting: Cardiovascular Disease

## 2018-01-09 ENCOUNTER — Other Ambulatory Visit: Payer: Self-pay | Admitting: Family Medicine

## 2018-01-09 DIAGNOSIS — N281 Cyst of kidney, acquired: Secondary | ICD-10-CM

## 2018-01-12 ENCOUNTER — Other Ambulatory Visit: Payer: Self-pay | Admitting: Cardiovascular Disease

## 2018-01-16 ENCOUNTER — Other Ambulatory Visit: Payer: Self-pay | Admitting: Urology

## 2018-01-16 DIAGNOSIS — R972 Elevated prostate specific antigen [PSA]: Secondary | ICD-10-CM

## 2018-01-24 ENCOUNTER — Other Ambulatory Visit: Payer: Medicare Other

## 2018-01-24 ENCOUNTER — Ambulatory Visit
Admission: RE | Admit: 2018-01-24 | Discharge: 2018-01-24 | Disposition: A | Discharge status: Home / Self Care | Payer: Medicare Other | Source: Ambulatory Visit | Attending: Family Medicine | Admitting: Family Medicine

## 2018-01-24 DIAGNOSIS — N281 Cyst of kidney, acquired: Secondary | ICD-10-CM

## 2018-01-24 MED ORDER — GADOBENATE DIMEGLUMINE 529 MG/ML IV SOLN
15.0000 mL | Freq: Once | INTRAVENOUS | Status: AC | PRN
  Administered 2018-01-24: 15 mL via INTRAVENOUS

## 2018-01-31 ENCOUNTER — Ambulatory Visit
Admission: RE | Admit: 2018-01-31 | Discharge: 2018-01-31 | Disposition: A | Discharge status: Home / Self Care | Payer: Medicare Other | Source: Ambulatory Visit | Attending: Urology | Admitting: Urology

## 2018-01-31 ENCOUNTER — Other Ambulatory Visit: Payer: Medicare Other

## 2018-01-31 DIAGNOSIS — R972 Elevated prostate specific antigen [PSA]: Secondary | ICD-10-CM

## 2018-01-31 MED ORDER — GADOBENATE DIMEGLUMINE 529 MG/ML IV SOLN
15.0000 mL | Freq: Once | INTRAVENOUS | Status: AC | PRN
  Administered 2018-01-31: 15 mL via INTRAVENOUS

## 2018-06-12 ENCOUNTER — Telehealth: Payer: Self-pay | Admitting: Cardiovascular Disease

## 2018-06-12 NOTE — Telephone Encounter (Signed)
New message   Patient is calling to see if he can be worked in for his yearly f/u before 07/05/2018 he is scheduled for 08/08/2018. The patient states that he will be in travel status for up to 3 months. I checked and Dr. Elease Hashimoto does not have an  appt available and the PA's do not have anything available either. Please call patient to discuss.

## 2018-06-13 NOTE — Telephone Encounter (Signed)
Spoke with patient and advised that Dr. Elease Hashimoto may have an opening on Monday Sept. 9. I advised that I will call him back tomorrow to verify a potential appointment on Monday afternoon. He thanked me for the call.

## 2018-06-14 NOTE — Telephone Encounter (Signed)
Notified patient that he is scheduled to see Dr. Elease Hashimoto on Monday 9/9 at 3:40. He thanked me for the call.

## 2018-06-17 ENCOUNTER — Encounter: Payer: Self-pay | Admitting: Nurse Practitioner

## 2018-06-17 ENCOUNTER — Encounter: Payer: Self-pay | Admitting: Cardiovascular Disease

## 2018-06-17 ENCOUNTER — Ambulatory Visit: Payer: Medicare Other | Admitting: Cardiovascular Disease

## 2018-06-17 VITALS — BP 136/76 | HR 72 | Ht 69.0 in | Wt 164.0 lb

## 2018-06-17 DIAGNOSIS — I480 Paroxysmal atrial fibrillation: Secondary | ICD-10-CM

## 2018-06-17 DIAGNOSIS — E782 Mixed hyperlipidemia: Secondary | ICD-10-CM | POA: Diagnosis not present

## 2018-06-17 DIAGNOSIS — R0609 Other forms of dyspnea: Secondary | ICD-10-CM

## 2018-06-17 NOTE — Patient Instructions (Signed)
Medication Instructions:  Your physician recommends that you continue on your current medications as directed. Please refer to the Current Medication list given to you today.   Labwork: None Ordered   Testing/Procedures: Your physician has requested that you have an exercise stress myoview. For further information please visit [email or web address]. Please follow instruction sheet, as given.    Follow-Up: Your physician wants you to follow-up in: 1 year with Dr. Nahser.  You will receive a reminder letter in the mail two months in advance. If you don't receive a letter, please call our office to schedule the follow-up appointment.   If you need a refill on your cardiac medications before your next appointment, please call your pharmacy.   Thank you for choosing [facility]! Winnie Barsky, RN [phone]    

## 2018-06-17 NOTE — Progress Notes (Signed)
[name] Date of Birth  [birth year]  [facility] [address]    [place], [place]  [address] [phone]  Fax  [phone]    [name] is a 72 y.o. gentleman who I met in 2008. He is a retired Scientist, physiological at  [employer]. He has intermittent atrial fibrillation and is also has some chest pains. He had a stress Myoview study in 2008 which revealed normal left ventricular systolic function and revealed no evidence of ischemia.  He has retired and moved to [place], [place].  He is exercising regularly.  He does lots of landscaping work.  He's able to be fairly vigorous exercise without any significant problems.  Sept. 23, 2014:  [name] is doing well.  He is enjoying retirement.  He just got back from a hiking trip in [place].  He is busy chopping wood and hiking without CP or dyspnea.    He has occasional palpitations.    Oct. 13, 2015:  [name] is going to [place] this week.    He remains very active. He denies any chest pain or shortness of breath.  Nov. 17, 2016 [name] is doing well.  Has some questions / issues with our corporate .   March 09, 2016:  [name] is seen back for follow up of his paroxysmal atrial ablation and hyperlipidemia. Doing well.   Just got back from [place]. ( [place], [place], [place])  Sept. 6, 2018:  Has been doing well [place] to [place] and [place] recently.   No Pain, no dyspne.  No recurrent atrial fib   June 17, 2018: [name] is seen back for further follow-up of his hyperlipidemia and paroxysmal atrial fibrillation. Doing well Went to [place] earlier this year ,  Has always traveled quite a bit  Has had some prostate biopsies  Was hiking on his property several months ago  Relates a similar episode when he was hiking in [place], [place] .  No CP ,  But he has had several episodes of shortness of breath with exertion while hiking.  He has a family history of coronary artery disease and coronary artery bypass grafting.  Current  Outpatient Medications on File Prior to Visit  Medication Sig Dispense Refill  . aspirin EC 81 MG tablet Take 1 tablet (81 mg total) by mouth daily. 90 tablet 3  . atorvastatin (LIPITOR) 20 MG tablet TAKE 1 TABLET BY MOUTH EVERY DAY AT 6 PM 90 tablet 1  . Multiple Vitamin (MULTIVITAMIN) tablet Take 1 tablet by mouth daily.      . tamsulosin (FLOMAX) 0.4 MG CAPS capsule Take 0.4 mg by mouth daily.     No current facility-administered medications on file prior to visit.     No Known Allergies  Past Medical History:  Diagnosis Date  . Chest pain    INTERMITTENT  . Intermittent atrial fibrillation [facility])     Past Surgical History:  Procedure Laterality Date  . CARDIOVASCULAR STRESS TEST  07/29/2007   EF 63%. NO ISCHEMIA  . TONSILLECTOMY    . TRANSTHORACIC ECHOCARDIOGRAM  07/01/2007    Social History   Tobacco Use  Smoking Status Former Smoker  . Last attempt to quit: 08/17/1981  . Years since quitting: 36.8  Smokeless Tobacco Never Used    Social History   Substance and Sexual Activity  Alcohol Use No    Family History  Problem Relation Age of Onset  . Coronary artery disease Father     Reviw of Systems:   Noted in  current history, otherwise review systems is negative.   Physical Exam: Blood pressure 136/76, pulse 72, height '5\' 9"'$  (1.753 m), weight 164 lb (74.4 kg), SpO2 98 %.  GEN:  Well nourished, well developed in no acute distress HEENT: Normal NECK: No JVD; No carotid bruits LYMPHATICS: No lymphadenopathy CARDIAC: RRR , no murmurs, rubs, gallops RESPIRATORY:  Clear to auscultation without rales, wheezing or rhonchi  ABDOMEN: Soft, non-tender, non-distended MUSCULOSKELETAL:  No edema; No deformity  SKIN: Warm and dry NEUROLOGIC:  Alert and oriented x 3  ECG: June 17, 2018: Normal sinus rhythm at 72.  Minimal voltage criteria for left ventricular hypertrophy. Assessment / Plan:   1.  Shortness of breath with exertion: [name] presents with episodes  of shortness of breath with exertion .   This may be an angina equivalent .  This is fairly new for him.  He typically is very healthy.  He hikes on a regular basis but now is having some shortness of breath with exertion.  He has a family history of coronary artery disease/coronary artery bypass grafting.  I would like to perform a exercise Myoview study for further evaluation. We will see him again in 1 year.  1. Hyperlipidemia: Recent labs from his primary medical doctor look great.  Continue current medications.  2. Paroxysmal atrial fibrillation:   Remains in NSR     Mertie Moores, MD  06/17/2018 4:27 PM    [place],  [place], [place]  [address] Pager [phone] Phone: [phone]; Fax: [phone]

## 2018-06-19 ENCOUNTER — Telehealth (HOSPITAL_COMMUNITY): Payer: Self-pay

## 2018-06-19 NOTE — Telephone Encounter (Signed)
Encounter complete. 

## 2018-06-21 ENCOUNTER — Ambulatory Visit (HOSPITAL_COMMUNITY)
Admission: RE | Admit: 2018-06-21 | Discharge: 2018-06-21 | Disposition: A | Discharge status: Home / Self Care | Payer: Medicare Other | Source: Ambulatory Visit | Attending: Cardiovascular Disease | Admitting: Cardiovascular Disease

## 2018-06-21 DIAGNOSIS — R0609 Other forms of dyspnea: Secondary | ICD-10-CM | POA: Insufficient documentation

## 2018-06-21 LAB — MYOCARDIAL PERFUSION IMAGING
CHL CUP NUCLEAR SDS: 2
CHL CUP NUCLEAR SRS: 2
CSEPEDS: 42 s
CSEPHR: 102 %
CSEPPHR: 150 {beats}/min
Estimated workload: 11.2 METS
Exercise duration (min): 9 min
LV dias vol: 126 mL (ref 62–150)
LVSYSVOL: 58 mL
MPHR: 147 {beats}/min
RPE: 16
Rest HR: 66 {beats}/min
SSS: 4
TID: 0.94

## 2018-06-21 MED ORDER — TECHNETIUM TC 99M TETROFOSMIN IV KIT
10.8000 | PACK | Freq: Once | INTRAVENOUS | Status: AC | PRN
  Administered 2018-06-21: 10.8 via INTRAVENOUS
  Filled 2018-06-21: qty 11

## 2018-06-21 MED ORDER — TECHNETIUM TC 99M TETROFOSMIN IV KIT
32.0000 | PACK | Freq: Once | INTRAVENOUS | Status: AC | PRN
  Administered 2018-06-21: 32 via INTRAVENOUS
  Filled 2018-06-21: qty 32

## 2018-07-10 ENCOUNTER — Other Ambulatory Visit: Payer: Self-pay | Admitting: Cardiovascular Disease

## 2018-08-08 ENCOUNTER — Ambulatory Visit: Payer: Medicare Other | Admitting: Cardiovascular Disease

## 2019-07-03 ENCOUNTER — Other Ambulatory Visit: Payer: Self-pay | Admitting: Cardiovascular Disease

## 2019-08-01 ENCOUNTER — Other Ambulatory Visit: Payer: Self-pay | Admitting: Cardiovascular Disease

## 2019-09-07 ENCOUNTER — Other Ambulatory Visit: Payer: Self-pay | Admitting: Cardiovascular Disease

## 2019-09-22 ENCOUNTER — Other Ambulatory Visit: Payer: Self-pay | Admitting: Cardiovascular Disease

## 2019-10-11 ENCOUNTER — Other Ambulatory Visit: Payer: Self-pay | Admitting: Cardiovascular Disease

## 2019-12-16 IMAGING — MR MR ABDOMEN WO/W CM
17 series · 48 of 48 positions shown · IV contrast (Multihance 15ml)
Comparison: MRI of the abdomen 09/09/2016.

CLINICAL DATA: 72-year-old male with history of subcapsular lesion
in the upper pole the right kidney. Followup study.

EXAM:
MRI ABDOMEN WITHOUT AND WITH CONTRAST
TECHNIQUE: Multiplanar multisequence MR imaging of the abdomen was performed
both before and after the administration of intravenous contrast.
CONTRAST:  15mL MULTIHANCE GADOBENATE DIMEGLUMINE 529 MG/ML IV SOLN

[Series 5: T2 · coronal · 4.5mm · 1.56mm/px · 1 of 36 slices shown (1 of 3)]
[im 1/36]
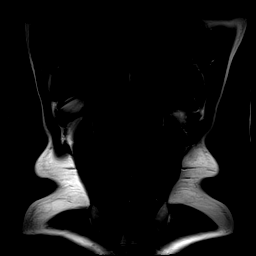

[Series 6: T1 · axial · 3.0mm · 1.19mm/px · z∈[-126,+87]mm · 5 of 144 slices shown]
[im 1/144]
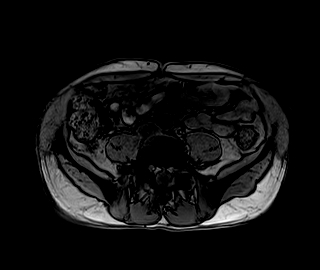
[im 36/144]
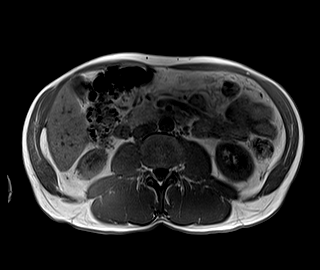
[im 72/144]
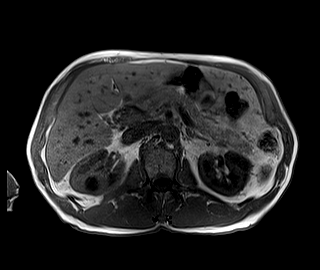
[im 108/144]
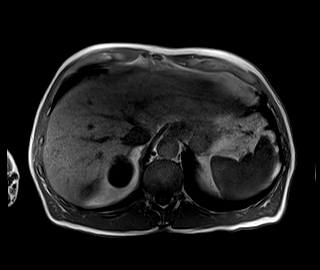
[im 144/144]
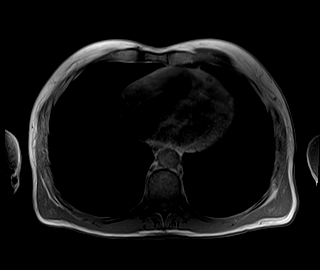

[Series 7: T2 · axial · 6.0mm · 1.22mm/px · 1 of 30 slices shown (2 of 3)]
[im 1/30]
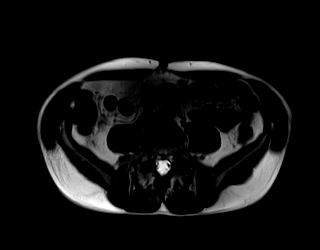

[Series 8: bSSFP · axial · 5.0mm · 1.18mm/px · z∈[-130,+92]mm · 2 of 38 slices shown]
[im 1/38]
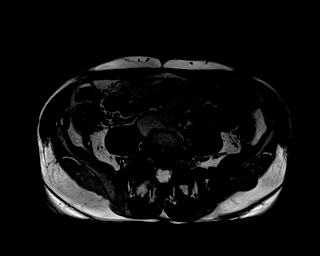
[im 38/38]
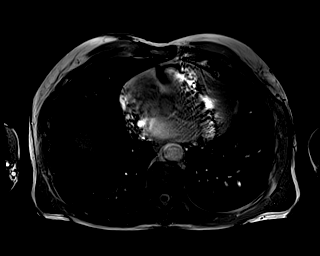

[Series 9: T2 · axial · 5.0mm · 1.48mm/px · z∈[-124,+110]mm · 2 of 40 slices shown (3 of 3)]
[im 1/40]
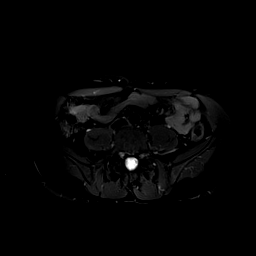
[im 40/40]
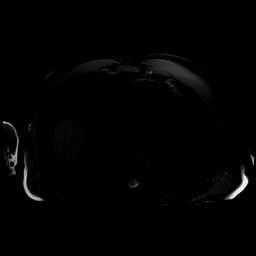

[Series 10: DWI · axial · 5.0mm · 1.42mm/px · z∈[-124,+110]mm · 5 of 120 slices shown (1 of 2)]
[im 1/120]
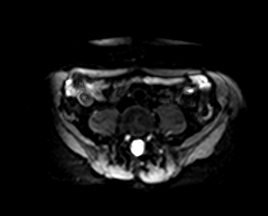
[im 30/120]
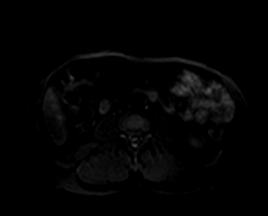
[im 60/120]
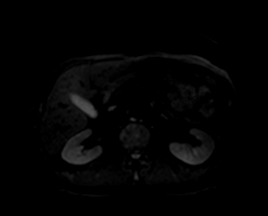
[im 90/120]
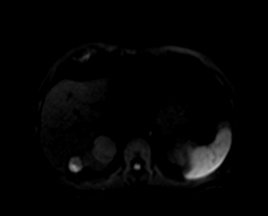
[im 120/120]
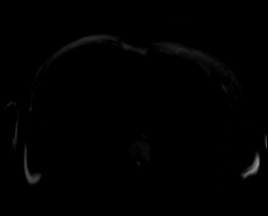

[Series 11: DWI · axial · 5.0mm · 1.42mm/px · z∈[-124,+110]mm · 2 of 40 slices shown (2 of 2)]
[im 1/40]
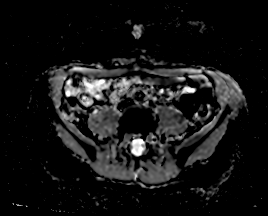
[im 40/40]
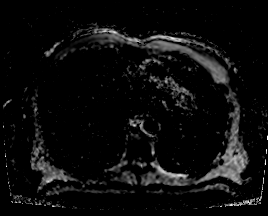

[Series 12: T1 dynamic · axial · non-contrast · 3.0mm · 1.25mm/px · z∈[-126,+87]mm · 3 of 72 slices shown]
[im 1/72]
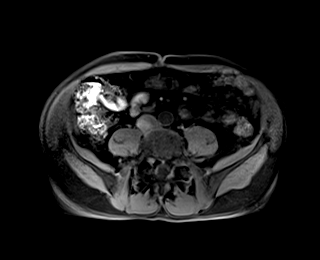
[im 36/72]
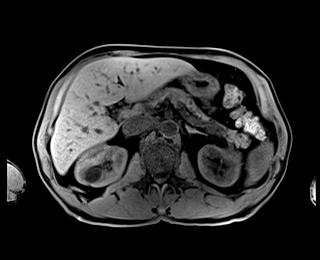
[im 72/72]
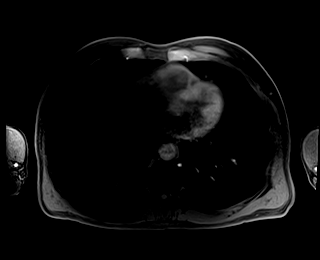

[Series 13: T1 dynamic post-contrast · axial · 3.0mm · 1.25mm/px · z∈[-126,+87]mm · 3 of 72 slices shown (1 of 9)]
[im 1/72]
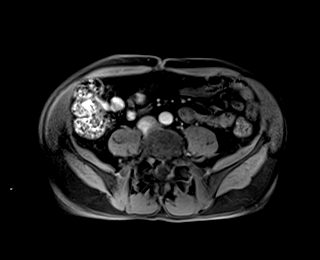
[im 36/72]
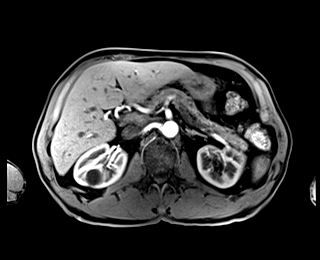
[im 72/72]
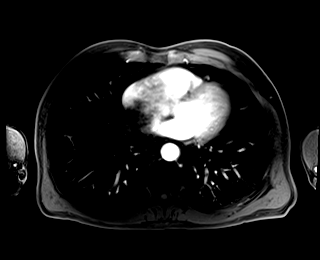

[Series 14: T1 dynamic post-contrast · axial · 3.0mm · 1.25mm/px · z∈[-126,+87]mm · 3 of 72 slices shown (2 of 9)]
[im 1/72]
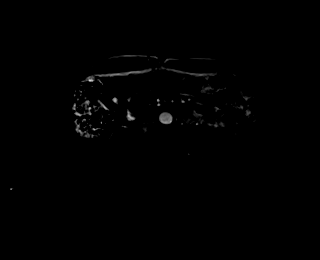
[im 36/72]
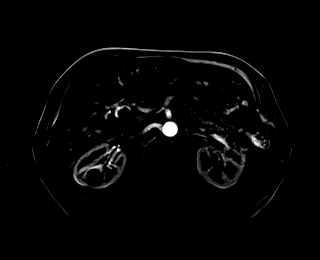
[im 72/72]
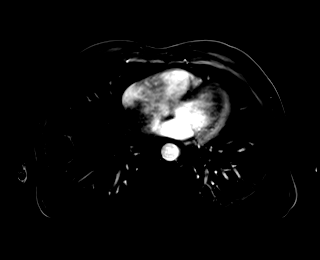

[Series 15: T1 dynamic post-contrast · axial · 3.0mm · 1.25mm/px · z∈[-126,+87]mm · 3 of 72 slices shown (3 of 9)]
[im 1/72]
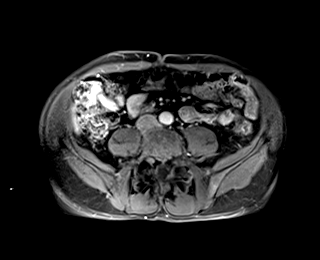
[im 36/72]
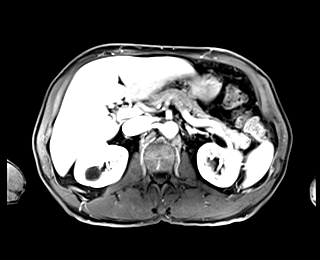
[im 72/72]
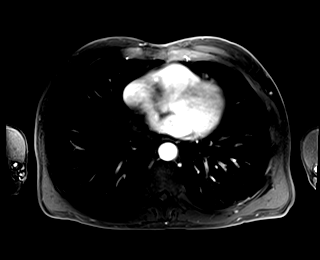

[Series 16: T1 dynamic post-contrast · axial · 3.0mm · 1.25mm/px · z∈[-126,+87]mm · 3 of 72 slices shown (4 of 9)]
[im 1/72]
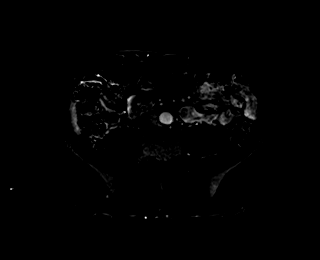
[im 36/72]
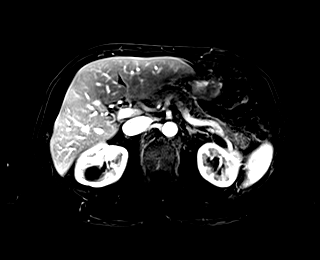
[im 72/72]
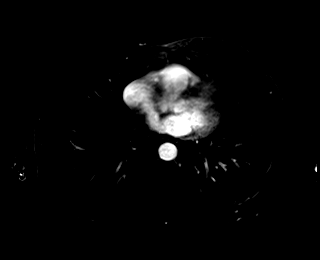

[Series 17: T1 dynamic post-contrast · axial · 3.0mm · 1.25mm/px · z∈[-126,+87]mm · 3 of 72 slices shown (5 of 9)]
[im 1/72]
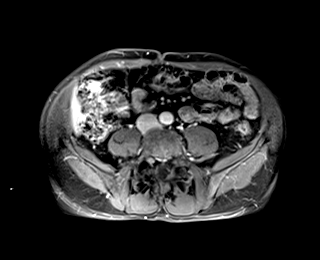
[im 36/72]
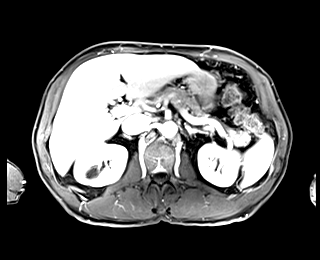
[im 72/72]
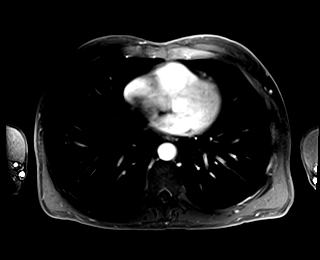

[Series 18: T1 dynamic post-contrast · axial · 3.0mm · 1.25mm/px · z∈[-126,+87]mm · 3 of 72 slices shown (6 of 9)]
[im 1/72]
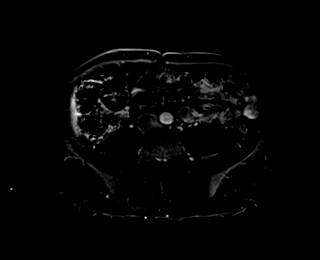
[im 36/72]
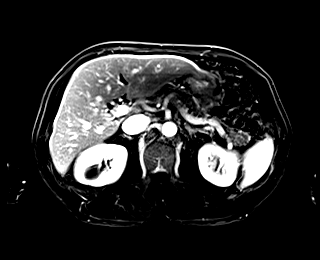
[im 72/72]
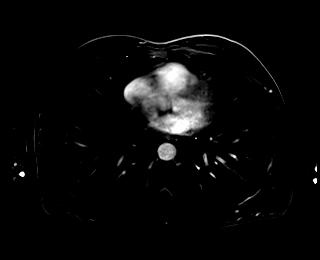

[Series 19: T1 dynamic post-contrast · coronal · 3.0mm · 1.25mm/px · 3 of 64 slices shown (7 of 9)]
[im 1/64]
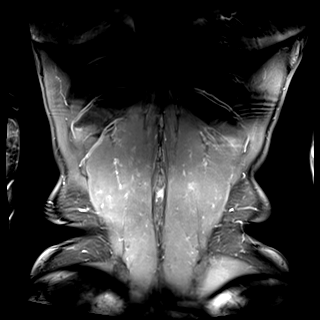
[im 32/64]
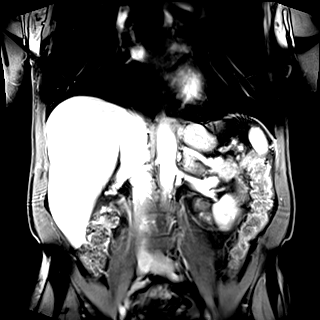
[im 64/64]
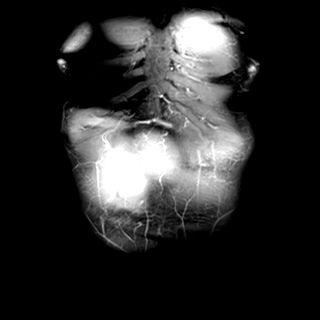

[Series 20: T1 dynamic post-contrast · axial · 3.0mm · 1.25mm/px · z∈[-126,+87]mm · 3 of 72 slices shown (8 of 9)]
[im 1/72]
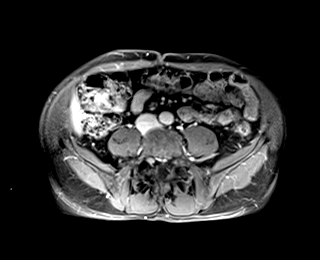
[im 36/72]
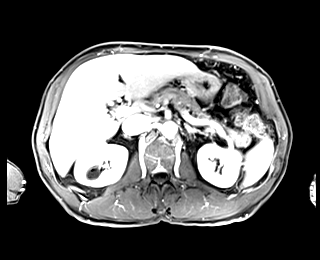
[im 72/72]
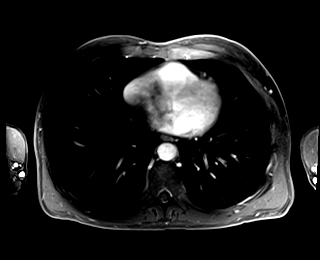

[Series 21: T1 dynamic post-contrast · axial · 3.0mm · 1.25mm/px · z∈[-126,+87]mm · 3 of 72 slices shown (9 of 9)]
[im 1/72]
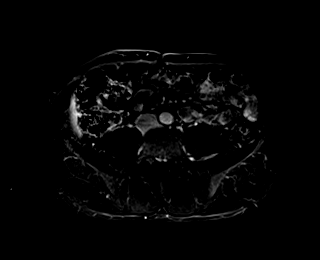
[im 36/72]
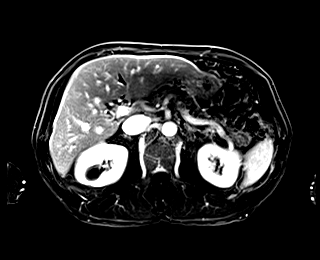
[im 72/72]
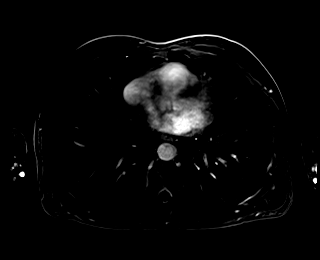

[48 of 48 positions shown; findings below may reference images not displayed]

FINDINGS: Lower chest: Unremarkable.

Hepatobiliary: No cystic or solid hepatic lesions. No intra or
extrahepatic biliary ductal dilatation. Gallbladder is normal in
appearance.

Pancreas: No pancreatic mass. No pancreatic ductal dilatation. No
pancreatic or peripancreatic fluid or inflammatory changes.

Spleen:  Unremarkable.

Adrenals/Urinary Tract: The lesion of concern on the prior
examination has decreased in size compared to the prior study,
currently measuring 3.1 x 2.5 x 2.7 cm (axial image 31 of series 12
and coronal image 5 of series 5). This lesion is exophytic, and is
heterogeneous in signal intensity but generally T1 hyperintense and
T2 hypointense, without definitive internal enhancement, most
compatible with resolving hemorrhagic cyst. Several other T1
hypointense, T2 hyperintense, nonenhancing lesions are noted in the
kidneys bilaterally, the largest of which is partially exophytic in
the upper pole of the right kidney measuring 4 cm, compatible with
simple cysts. No other suspicious renal lesions. No
hydroureteronephrosis in the visualized portions of the abdomen.
Bilateral adrenal glands are normal in appearance.

Stomach/Bowel: Visualized portions are unremarkable.

Vascular/Lymphatic: Aortic atherosclerosis without definite aneurysm
in the abdominal vasculature. No lymphadenopathy noted in the
abdomen.

Other: No significant volume of ascites noted in the visualized
portions of the peritoneal cavity.

Musculoskeletal: No aggressive appearing osseous lesions are noted
in the visualized portions of the skeleton.
IMPRESSION: 1. Previously noted exophytic lesion in the upper pole of the right
kidney has decreased in size compared to the prior examination, and
is again most compatible with an involuting hemorrhagic cyst.

## 2019-12-24 ENCOUNTER — Other Ambulatory Visit: Payer: Self-pay | Admitting: Cardiovascular Disease

## 2020-01-14 ENCOUNTER — Ambulatory Visit: Payer: Medicare Other | Admitting: Cardiovascular Disease

## 2020-06-20 ENCOUNTER — Other Ambulatory Visit: Payer: Self-pay | Admitting: Cardiovascular Disease

## 2020-06-22 ENCOUNTER — Telehealth: Payer: Self-pay | Admitting: Cardiovascular Disease

## 2020-06-22 NOTE — Telephone Encounter (Signed)
-----   Message from Fonda Kinder, CMA sent at 06/22/2020  9:37 AM EDT ----- Regarding: Office visit needed Patient recently requested refill on Atorvastatin, patient is overdue for office visit. Patients last office visit was 06/17/18 and labs last drawn on 01/04/18, patient needs to schedule office visit prior to any refills. KW

## 2020-06-22 NOTE — Telephone Encounter (Signed)
LVM for patient to call and schedule an appointment for refills.

## 2020-06-22 NOTE — Telephone Encounter (Signed)
Hey could you please send this message to scheduling so pt can make an appt.  Thanks

## 2020-07-08 ENCOUNTER — Other Ambulatory Visit: Payer: Self-pay | Admitting: Cardiovascular Disease

## 2020-07-22 ENCOUNTER — Telehealth: Payer: Self-pay | Admitting: Cardiovascular Disease

## 2020-07-30 NOTE — Telephone Encounter (Signed)
*  STAT* If patient is at the pharmacy, call can be transferred to refill team.   1. Which medications need to be refilled? (please list name of each medication and dose if known)  atorvastatin (LIPITOR) 20 MG tablet  2. Which pharmacy/location (including street and city if local pharmacy) is medication to be sent to?  [id number] [place], [place] - [address]  3. Do they need a 30 day or 90 day supply? 90  Patient is calling to check on the status of this refill.

## 2020-09-22 ENCOUNTER — Other Ambulatory Visit: Payer: Self-pay | Admitting: Cardiovascular Disease

## 2020-10-12 ENCOUNTER — Ambulatory Visit: Payer: Medicare Other | Admitting: Cardiovascular Disease

## 2020-10-15 ENCOUNTER — Other Ambulatory Visit: Payer: Self-pay | Admitting: Cardiovascular Disease

## 2020-12-20 ENCOUNTER — Other Ambulatory Visit: Payer: Self-pay | Admitting: Cardiovascular Disease

## 2020-12-30 ENCOUNTER — Other Ambulatory Visit: Payer: Self-pay | Admitting: Cardiovascular Disease

## 2021-01-16 ENCOUNTER — Other Ambulatory Visit: Payer: Self-pay | Admitting: Cardiovascular Disease

## 2021-01-19 ENCOUNTER — Ambulatory Visit: Payer: Medicare PPO | Admitting: Cardiovascular Disease

## 2023-10-01 ENCOUNTER — Emergency Department (HOSPITAL_COMMUNITY): Payer: Medicare PPO

## 2023-10-01 ENCOUNTER — Emergency Department (HOSPITAL_COMMUNITY)
Admission: EM | Admit: 2023-10-01 | Discharge: 2023-10-02 | Disposition: A | Discharge status: Home / Self Care | Payer: Medicare PPO | Attending: Emergency Medicine | Admitting: Emergency Medicine

## 2023-10-01 ENCOUNTER — Encounter (HOSPITAL_COMMUNITY): Payer: Self-pay

## 2023-10-01 ENCOUNTER — Other Ambulatory Visit: Payer: Self-pay

## 2023-10-01 DIAGNOSIS — F12929 Cannabis use, unspecified with intoxication, unspecified: Secondary | ICD-10-CM

## 2023-10-01 DIAGNOSIS — R4781 Slurred speech: Secondary | ICD-10-CM | POA: Diagnosis not present

## 2023-10-01 DIAGNOSIS — T40721A Poisoning by synthetic cannabinoids, accidental (unintentional), initial encounter: Secondary | ICD-10-CM | POA: Insufficient documentation

## 2023-10-01 DIAGNOSIS — R1110 Vomiting, unspecified: Secondary | ICD-10-CM | POA: Diagnosis present

## 2023-10-01 DIAGNOSIS — R4182 Altered mental status, unspecified: Secondary | ICD-10-CM | POA: Diagnosis not present

## 2023-10-04 ENCOUNTER — Encounter: Payer: Self-pay | Admitting: Cardiovascular Disease
